# Patient Record
Sex: Female | Born: 1990 | Race: White | Hispanic: No | Marital: Married | Smoking: Current every day smoker
Health system: Southern US, Community
[De-identification: ages and names within clinical notes are randomized; demographics above are authoritative.]

## PROBLEM LIST (undated history)

## (undated) ENCOUNTER — Inpatient Hospital Stay (HOSPITAL_COMMUNITY): Payer: Self-pay

## (undated) DIAGNOSIS — K219 Gastro-esophageal reflux disease without esophagitis: Secondary | ICD-10-CM

## (undated) DIAGNOSIS — F329 Major depressive disorder, single episode, unspecified: Secondary | ICD-10-CM

## (undated) DIAGNOSIS — F32A Depression, unspecified: Secondary | ICD-10-CM

## (undated) DIAGNOSIS — F419 Anxiety disorder, unspecified: Secondary | ICD-10-CM

## (undated) DIAGNOSIS — J45909 Unspecified asthma, uncomplicated: Secondary | ICD-10-CM

## (undated) DIAGNOSIS — O24419 Gestational diabetes mellitus in pregnancy, unspecified control: Secondary | ICD-10-CM

## (undated) HISTORY — PX: TOOTH EXTRACTION: SUR596

---
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
## 2012-04-05 IMAGING — CR DG ELBOW COMPLETE 3+V*L*
3 series · 3 of 3 positions shown · non-contrast
Comparison: None

CLINICAL DATA: Fell.  Left elbow pain.

LEFT ELBOW - COMPLETE 3+ VIEW

[view not recorded (1 of 3)]
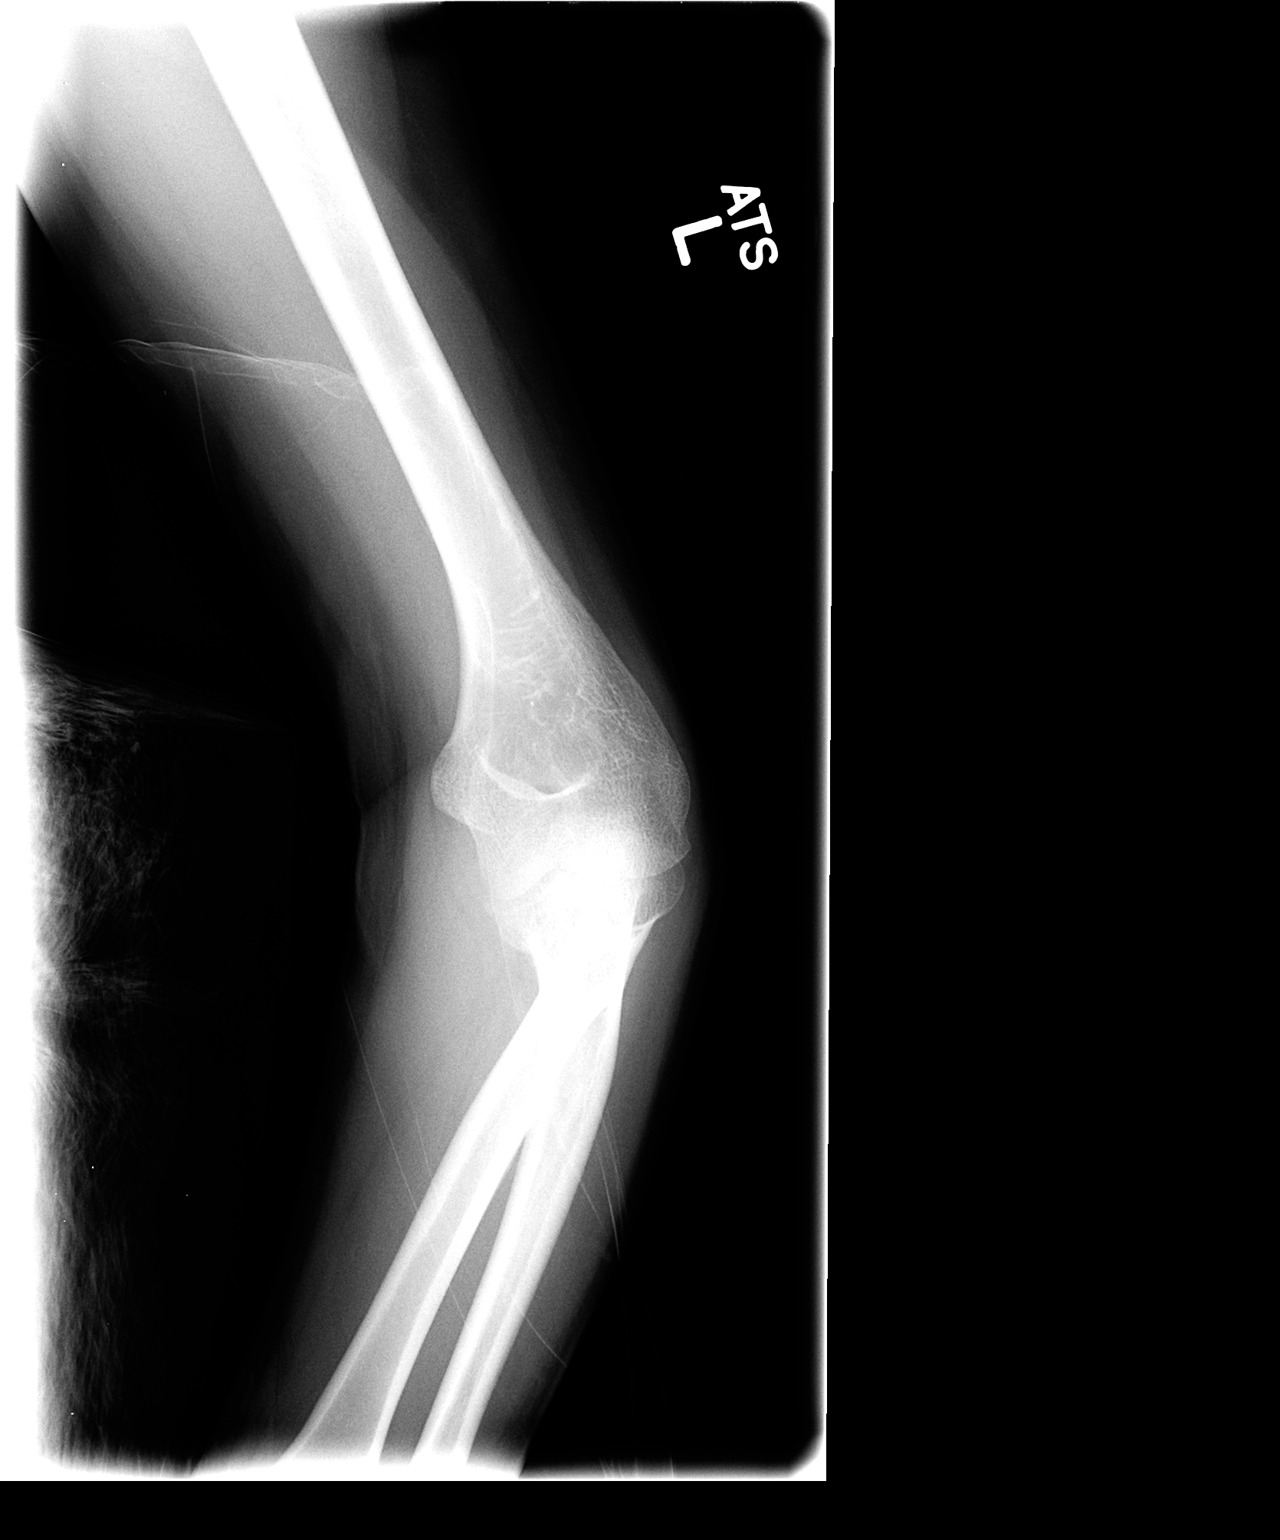

[view not recorded (2 of 3)]
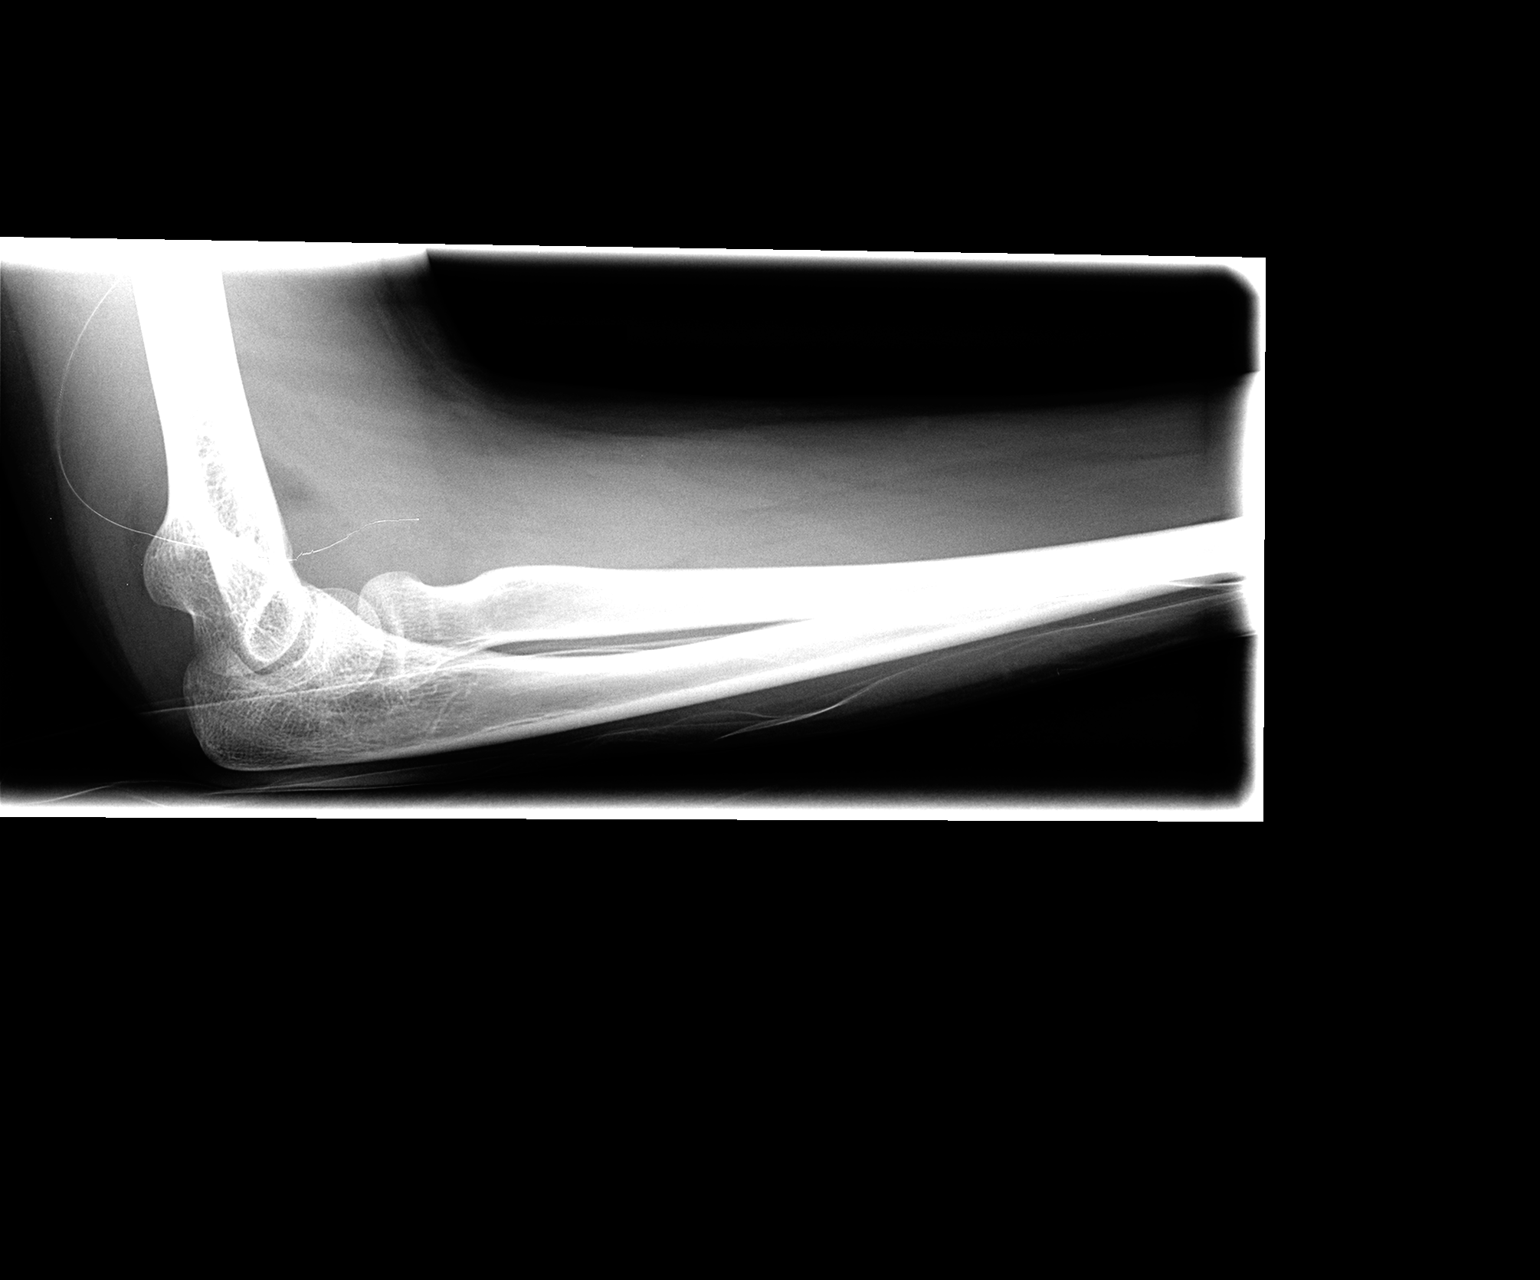

[view not recorded (3 of 3)]
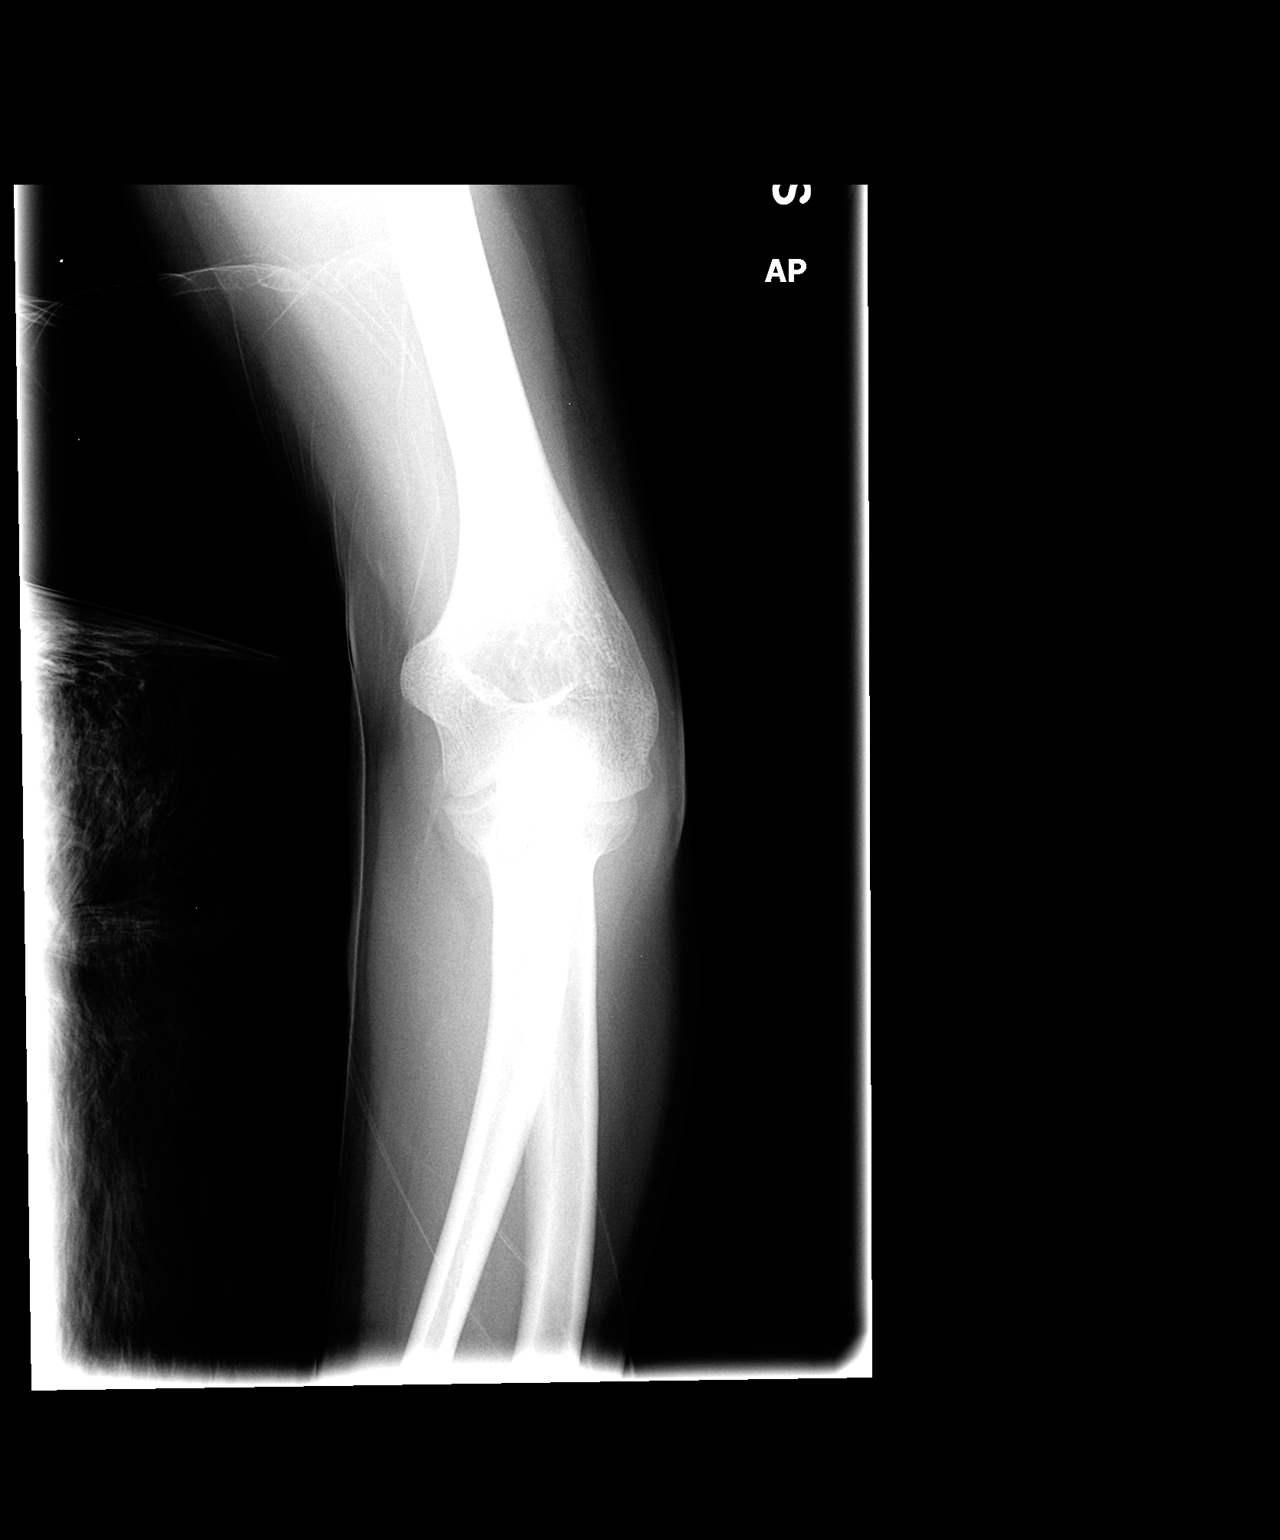

[3 of 3 positions shown; findings below may reference images not displayed]

FINDINGS: The lateral film demonstrates a large elbow joint
effusion.  No obvious fracture.  Limited views due to positioning.
IMPRESSION: 1.  Large elbow joint effusion
2.  No obvious fracture.

## 2012-09-18 LAB — OB RESULTS CONSOLE HEPATITIS B SURFACE ANTIGEN: Hepatitis B Surface Ag: NEGATIVE

## 2012-09-18 LAB — OB RESULTS CONSOLE RPR: RPR: NONREACTIVE

## 2012-09-18 LAB — OB RESULTS CONSOLE GC/CHLAMYDIA: Chlamydia: NEGATIVE

## 2012-09-18 LAB — OB RESULTS CONSOLE HIV ANTIBODY (ROUTINE TESTING): HIV: NONREACTIVE

## 2012-09-18 LAB — OB RESULTS CONSOLE RUBELLA ANTIBODY, IGM: Rubella: NON-IMMUNE/NOT IMMUNE

## 2012-12-30 NOTE — L&D Delivery Note (Signed)
Delivery Note At 2:55 PM a viable and healthy female was delivered via  (Presentation: Right; Occiput Anterior ).  APGAR: , ; weight pending.   Placenta status: spontaneous, intact.  Cord:  3V with a loose nuchal noted x 1  Anesthesia:  Epidural Episiotomy: None Lacerations: None Suture Repair: None Est. Blood Loss (mL): 250cc  Mom to postpartum.  Baby to nursery-stable.  [name] H. 03/23/2013, 3:15 PM

## 2013-02-02 ENCOUNTER — Inpatient Hospital Stay (HOSPITAL_COMMUNITY)
Admission: AD | Admit: 2013-02-02 | Discharge: 2013-02-02 | Disposition: A | Discharge status: Home / Self Care | Payer: Medicaid Other | Source: Ambulatory Visit | Attending: Obstetrics and Gynecology | Admitting: Obstetrics and Gynecology

## 2013-02-02 ENCOUNTER — Encounter (HOSPITAL_COMMUNITY): Payer: Self-pay

## 2013-02-02 DIAGNOSIS — O99891 Other specified diseases and conditions complicating pregnancy: Secondary | ICD-10-CM | POA: Insufficient documentation

## 2013-02-02 DIAGNOSIS — O479 False labor, unspecified: Secondary | ICD-10-CM

## 2013-02-02 DIAGNOSIS — N949 Unspecified condition associated with female genital organs and menstrual cycle: Secondary | ICD-10-CM | POA: Insufficient documentation

## 2013-02-02 DIAGNOSIS — O4700 False labor before 37 completed weeks of gestation, unspecified trimester: Secondary | ICD-10-CM

## 2013-02-02 DIAGNOSIS — N898 Other specified noninflammatory disorders of vagina: Secondary | ICD-10-CM

## 2013-02-02 HISTORY — DX: Gestational diabetes mellitus in pregnancy, unspecified control: O24.419

## 2013-02-02 HISTORY — DX: Major depressive disorder, single episode, unspecified: F32.9

## 2013-02-02 HISTORY — DX: Gastro-esophageal reflux disease without esophagitis: K21.9

## 2013-02-02 HISTORY — DX: Unspecified asthma, uncomplicated: J45.909

## 2013-02-02 HISTORY — DX: Depression, unspecified: F32.A

## 2013-02-02 HISTORY — DX: Anxiety disorder, unspecified: F41.9

## 2013-02-02 LAB — AMNISURE RUPTURE OF MEMBRANE (ROM) NOT AT ARMC: Amnisure ROM: NEGATIVE

## 2013-02-02 LAB — WET PREP, GENITAL

## 2013-02-02 NOTE — MAU Note (Signed)
Pt states had sex last pm, went to restroom, laid back down, got up again and had milky clear fluid running down her legs. Since then has had cramping, and feels like baby will fall out.

## 2013-02-02 NOTE — MAU Provider Note (Signed)
Chief Complaint:  ?ROM  First Provider Initiated Contact with Patient 02/02/13 1305      HPI: Robin Villarreal is a 22 y.o. G2P1001 at [redacted]w[redacted]d who presents to maternity admissions reporting "water broke". The patient states that she got up to go to the bathroom and felt something running down her legs afterwards. She has not felt any LOF since then. She states that she had some contractions earlier today, but has not felt any recently. She has a dull cramping in her lower pelvic area off and on this afternoon. She denies vaginal bleeding. She reports fetal movement, but feels it is slightly decreased this afternoon.    Past Medical History: Past Medical History  Diagnosis Date  . Asthma   . Gestational diabetes   . Anxiety   . Depression   . Acid reflux     Past obstetric history: OB History    Grav Para Term Preterm Abortions TAB SAB Ect Mult Living   2 1 1       1      # Outc Date GA Lbr Len/2nd Wgt Sex Del Anes PTL Lv   1 TRM            2 CUR               Past Surgical History: Past Surgical History  Procedure Date  . Vaginal delivery   . Tooth extraction     Family History: History reviewed. No pertinent family history.  Social History: History  Substance Use Topics  . Smoking status: Current Every Day Smoker -- 0.2 packs/day    Types: Cigarettes  . Smokeless tobacco: Never Used  . Alcohol Use: No    Allergies:  Allergies  Allergen Reactions  . Penicillins Rash    Meds:  No prescriptions prior to admission    ROS: Pertinent findings in history of present illness.  Physical Exam  Blood pressure 114/63, pulse 97, temperature 98.9 F (37.2 C), temperature source Oral, resp. rate 18, height 5' (1.524 m), weight 132 lb (59.875 kg), SpO2 98.00%. GENERAL: Well-developed, well-nourished female in no acute distress.  HEENT: normocephalic HEART: normal rate RESP: normal effort ABDOMEN: Soft, non-tender, gravid appropriate for gestational age EXTREMITIES:  Nontender, no edema NEURO: alert and oriented SPECULUM EXAM: Physiologic discharge, no blood, cervix clean, no pooling noted Dilation: Closed Effacement (%): Thick Cervical Position: Middle Presentation: Undeterminable Exam by:: Nickie Retort, PA/S. Gabriel Earing, RNC  FHT:  Baseline 150 , moderate variability, accelerations present, no decelerations Contractions: few    Labs: Results for orders placed during the hospital encounter of 02/02/13 (from the past 24 hour(s))  WET PREP, GENITAL     Status: Abnormal   Collection Time   02/02/13  1:10 PM      Component Value Range   Yeast Wet Prep HPF POC NONE SEEN  NONE SEEN   Trich, Wet Prep NONE SEEN  NONE SEEN   Clue Cells Wet Prep HPF POC NONE SEEN  NONE SEEN   WBC, Wet Prep HPF POC MANY (*) NONE SEEN  AMNISURE RUPTURE OF MEMBRANE (ROM)     Status: Normal   Collection Time   02/02/13  1:30 PM      Component Value Range   Amnisure ROM NEGATIVE       MAU Course: Pooling - negative Fern - unsure, most likely negative Amnisure - negative  Discussed patient with Dr. Henderson Cloud. She would like to discharge patient today and keep follow-up as scheduled.   Assessment:  1. Vaginal discharge in pregnancy   2. Braxton Hicks contractions     Plan: Discharge home Patient to keep follow-up as scheduled with Dr. Henderson Cloud Reassured that all tests were negative today and she is having Braxton-Hicks contractions that are not causing cervical dilation Patient may return to MAU as needed or if her condition were to change or worsen      Follow-up Information    Follow up with HORVATH,MICHELLE A, MD. (As scheduled)    Contact information:   [address] [place] [address] [phone]           Medication List     As of 02/02/2013  2:40 PM    TAKE these medications         acetaminophen 325 MG tablet   Commonly known as: TYLENOL   Take 650 mg by mouth every 6 (six) hours as needed. pain         Freddi Starr,  PA-C 02/02/2013 2:40 PM

## 2013-03-02 ENCOUNTER — Inpatient Hospital Stay (HOSPITAL_COMMUNITY)
Admission: AD | Admit: 2013-03-02 | Discharge: 2013-03-02 | Disposition: A | Discharge status: Home / Self Care | Payer: Medicaid Other | Source: Ambulatory Visit | Attending: Obstetrics and Gynecology | Admitting: Obstetrics and Gynecology

## 2013-03-02 ENCOUNTER — Encounter (HOSPITAL_COMMUNITY): Payer: Self-pay | Admitting: *Deleted

## 2013-03-02 DIAGNOSIS — A499 Bacterial infection, unspecified: Secondary | ICD-10-CM | POA: Insufficient documentation

## 2013-03-02 DIAGNOSIS — B9689 Other specified bacterial agents as the cause of diseases classified elsewhere: Secondary | ICD-10-CM | POA: Insufficient documentation

## 2013-03-02 DIAGNOSIS — O368190 Decreased fetal movements, unspecified trimester, not applicable or unspecified: Secondary | ICD-10-CM | POA: Insufficient documentation

## 2013-03-02 DIAGNOSIS — O99891 Other specified diseases and conditions complicating pregnancy: Secondary | ICD-10-CM | POA: Insufficient documentation

## 2013-03-02 DIAGNOSIS — N760 Acute vaginitis: Secondary | ICD-10-CM | POA: Insufficient documentation

## 2013-03-02 DIAGNOSIS — O2390 Unspecified genitourinary tract infection in pregnancy, unspecified trimester: Secondary | ICD-10-CM | POA: Insufficient documentation

## 2013-03-02 LAB — WET PREP, GENITAL: Trich, Wet Prep: NONE SEEN

## 2013-03-02 MED ORDER — METRONIDAZOLE 500 MG PO TABS: 500.0000 mg | ORAL_TABLET | Freq: Two times a day (BID) | ORAL | Status: DC

## 2013-03-02 NOTE — MAU Note (Addendum)
Last night had watery d/c( one time thing).  This morning had thick white d/c when got up. Has been having a lot of pelvic pressure, dr told her to come here.

## 2013-03-02 NOTE — MAU Note (Signed)
Pt states she noticed fluid leaking last night, pt states she feels like she has fluid leaking presently. Pt states she had pain before nurse entered the room

## 2013-03-02 NOTE — MAU Provider Note (Signed)
History     CSN: [id number]  Arrival date and time: 03/02/13 1644   First Provider Initiated Contact with Patient 03/02/13 1741      Chief Complaint  Patient presents with  . Labor Eval   HPI Robin Villarreal is 22 y.o. G2P1001 [redacted]w[redacted]d weeks presenting with report that she felt her pelvic bone cracked last night and questioned if baby had dropped.  This am saw a large amount of thick discharge and saw brownish tint when she wiped.   States she has been checked in the office and told she was 3 cms.  Denies vaginal bleeding.   2 contractions today.  Feels wet, like a leak.  Denies gush of fluid.  Decreased fetal movement today.     Past Medical History  Diagnosis Date  . Asthma   . Gestational diabetes   . Anxiety   . Depression   . Acid reflux     Past Surgical History  Procedure Laterality Date  . Vaginal delivery    . Tooth extraction      Family History  Problem Relation Age of Onset  . Diabetes Maternal Grandmother   . Hypertension Maternal Grandmother     History  Substance Use Topics  . Smoking status: Current Every Day Smoker -- 0.25 packs/day    Types: Cigarettes  . Smokeless tobacco: Never Used  . Alcohol Use: No    Allergies:  Allergies  Allergen Reactions  . Penicillins Rash    Prescriptions prior to admission  Medication Sig Dispense Refill  . acetaminophen (TYLENOL) 325 MG tablet Take 650 mg by mouth every 6 (six) hours as needed. pain      . famotidine (PEPCID) 10 MG tablet Take 10 mg by mouth daily.        Review of Systems  Constitutional: Negative.   Respiratory: Negative.   Cardiovascular: Negative.   Gastrointestinal: Negative for abdominal pain.  Genitourinary:       ? Leaking of fluid.  + for thick white brown stained discharge  Neurological: Negative for headaches.   Physical Exam   Blood pressure 130/76, pulse 106, temperature 98.2 F (36.8 C), temperature source Oral, resp. rate 18, height 4' 11.5" (1.511 m), weight 141 lb  (63.957 kg).  Physical Exam  Nursing note and vitals reviewed. Constitutional: She is oriented to person, place, and time. She appears well-developed and well-nourished. No distress.  HENT:  Head: Normocephalic.  Neck: Normal range of motion.  Cardiovascular: Normal rate.   Respiratory: Effort normal.  GI: Soft. She exhibits no distension and no mass. There is no tenderness. There is no rebound and no guarding.  Genitourinary: There is no rash, tenderness or lesion on the right labia. There is no rash, tenderness or lesion on the left labia. Uterus is enlarged. Uterus is not tender. Cervix exhibits no friability. No bleeding around the vagina. Vaginal discharge (yellowish, frothy discharge with odor) found.  Negative for pooling of fluid.  Cervical exam by Massie Bougie, RN as high, 1cm, thick    Neurological: She is alert and oriented to person, place, and time.  Skin: Skin is warm and dry.  Psychiatric: She has a normal mood and affect. Her behavior is normal.   Results for orders placed during the hospital encounter of 03/02/13 (from the past 24 hour(s))  WET PREP, GENITAL     Status: Abnormal   Collection Time    03/02/13  6:50 PM      Result Value Range   Yeast Wet  Prep HPF POC NONE SEEN  NONE SEEN   Trich, Wet Prep NONE SEEN  NONE SEEN   Clue Cells Wet Prep HPF POC FEW (*) NONE SEEN   WBC, Wet Prep HPF POC MANY (*) NONE SEEN  POCT FERN TEST     Status: None   Collection Time    03/02/13  7:03 PM      Result Value Range   POCT Fern Test Negative = intact amniotic membranes      MAU Course  Procedures   FMS reactive.  3 contractions seen with mild irritabilty  MDM Reported MSE to Dr. Henderson Cloud.  Order for fern.  May do AFI if good history of gush of fluid.  She reported leaking only, denies gush.  Fern and wet prep collected 20:00 care turned over to V. Katrinka Blazing, CNM.  No leaking while in MAU. Assessment and Plan  A:  Negative Fern 1. BV (bacterial vaginosis)    P:  D/C  home. Labor precautions, FKCs   Medication List    TAKE these medications       acetaminophen 325 MG tablet  Commonly known as:  TYLENOL  Take 650 mg by mouth every 6 (six) hours as needed. pain     famotidine 10 MG tablet  Commonly known as:  PEPCID  Take 10 mg by mouth daily.     metroNIDAZOLE 500 MG tablet  Commonly known as:  FLAGYL  Take 1 tablet (500 mg total) by mouth 2 (two) times daily.       Follow-up Information   Follow up with HORVATH,MICHELLE A, MD. (as scheduled)    Contact information:   [address] [place] [address] [phone]       Follow up with [facility] [place] [facility]. (As needed if symptoms worsen)    Contact information:   [address] [id number] [place] [address] [phone]        Matt Holmes 03/02/2013, 7:57 PM   Dorathy Kinsman, CNM 03/02/2013 8:54 PM

## 2013-03-23 ENCOUNTER — Encounter (HOSPITAL_COMMUNITY): Payer: Self-pay

## 2013-03-23 ENCOUNTER — Encounter (HOSPITAL_COMMUNITY): Payer: Self-pay | Admitting: Anesthesiology

## 2013-03-23 ENCOUNTER — Inpatient Hospital Stay (HOSPITAL_COMMUNITY)
Admission: AD | Admit: 2013-03-23 | Discharge: 2013-03-24 | DRG: 775 | Disposition: A | Discharge status: Home / Self Care | Payer: Medicaid Other | Source: Ambulatory Visit | Attending: Obstetrics and Gynecology | Admitting: Obstetrics and Gynecology

## 2013-03-23 ENCOUNTER — Inpatient Hospital Stay (HOSPITAL_COMMUNITY): Payer: Medicaid Other | Admitting: Anesthesiology

## 2013-03-23 LAB — CBC
HCT: 27.4 % — ABNORMAL LOW (ref 36.0–46.0)
Hemoglobin: 8.8 g/dL — ABNORMAL LOW (ref 12.0–15.0)
MCV: 87.8 fL (ref 78.0–100.0)
RBC: 3.12 MIL/uL — ABNORMAL LOW (ref 3.87–5.11)
WBC: 14.2 10*3/uL — ABNORMAL HIGH (ref 4.0–10.5)

## 2013-03-23 MED ORDER — METHYLERGONOVINE MALEATE 0.2 MG PO TABS: 0.2000 mg | ORAL_TABLET | ORAL | Status: DC | PRN

## 2013-03-23 MED ORDER — PHENYLEPHRINE 40 MCG/ML (10ML) SYRINGE FOR IV PUSH (FOR BLOOD PRESSURE SUPPORT)
80.0000 ug | PREFILLED_SYRINGE | INTRAVENOUS | Status: DC | PRN
  Filled 2013-03-23: qty 5

## 2013-03-23 MED ORDER — OXYCODONE-ACETAMINOPHEN 5-325 MG PO TABS
1.0000 | ORAL_TABLET | ORAL | Status: DC | PRN
  Administered 2013-03-23 – 2013-03-24 (×5): 1 via ORAL
  Filled 2013-03-23 (×5): qty 1

## 2013-03-23 MED ORDER — IBUPROFEN 600 MG PO TABS
600.0000 mg | ORAL_TABLET | Freq: Four times a day (QID) | ORAL | Status: DC
  Administered 2013-03-23 – 2013-03-24 (×3): 600 mg via ORAL
  Filled 2013-03-23 (×3): qty 1

## 2013-03-23 MED ORDER — LACTATED RINGERS IV SOLN: 500.0000 mL | INTRAVENOUS | Status: DC | PRN

## 2013-03-23 MED ORDER — OXYTOCIN BOLUS FROM INFUSION: 500.0000 mL | INTRAVENOUS | Status: DC

## 2013-03-23 MED ORDER — PHENYLEPHRINE 40 MCG/ML (10ML) SYRINGE FOR IV PUSH (FOR BLOOD PRESSURE SUPPORT): 80.0000 ug | PREFILLED_SYRINGE | INTRAVENOUS | Status: DC | PRN

## 2013-03-23 MED ORDER — BENZOCAINE-MENTHOL 20-0.5 % EX AERO
1.0000 "application " | INHALATION_SPRAY | CUTANEOUS | Status: DC | PRN
  Filled 2013-03-23: qty 56

## 2013-03-23 MED ORDER — LACTATED RINGERS IV SOLN
INTRAVENOUS | Status: DC
  Administered 2013-03-23: 14:00:00 via INTRAVENOUS

## 2013-03-23 MED ORDER — LIDOCAINE HCL (PF) 1 % IJ SOLN
30.0000 mL | INTRAMUSCULAR | Status: DC | PRN
  Filled 2013-03-23: qty 30

## 2013-03-23 MED ORDER — LIDOCAINE HCL (PF) 1 % IJ SOLN
INTRAMUSCULAR | Status: DC | PRN
  Administered 2013-03-23 (×4): 4 mL

## 2013-03-23 MED ORDER — ZOLPIDEM TARTRATE 5 MG PO TABS: 5.0000 mg | ORAL_TABLET | Freq: Every evening | ORAL | Status: DC | PRN

## 2013-03-23 MED ORDER — BUTORPHANOL TARTRATE 1 MG/ML IJ SOLN
1.0000 mg | Freq: Once | INTRAMUSCULAR | Status: AC
  Administered 2013-03-23: 1 mg via INTRAVENOUS
  Filled 2013-03-23: qty 1

## 2013-03-23 MED ORDER — OXYCODONE-ACETAMINOPHEN 5-325 MG PO TABS: 1.0000 | ORAL_TABLET | ORAL | Status: DC | PRN

## 2013-03-23 MED ORDER — LACTATED RINGERS IV SOLN
500.0000 mL | Freq: Once | INTRAVENOUS | Status: AC
  Administered 2013-03-23: 500 mL via INTRAVENOUS

## 2013-03-23 MED ORDER — FLEET ENEMA 7-19 GM/118ML RE ENEM: 1.0000 | ENEMA | RECTAL | Status: DC | PRN

## 2013-03-23 MED ORDER — OXYTOCIN 40 UNITS IN LACTATED RINGERS INFUSION - SIMPLE MED
62.5000 mL/h | INTRAVENOUS | Status: DC
  Administered 2013-03-23: 62.5 mL/h via INTRAVENOUS
  Filled 2013-03-23: qty 1000

## 2013-03-23 MED ORDER — IBUPROFEN 600 MG PO TABS
600.0000 mg | ORAL_TABLET | Freq: Four times a day (QID) | ORAL | Status: DC | PRN
  Administered 2013-03-23: 600 mg via ORAL
  Filled 2013-03-23: qty 1

## 2013-03-23 MED ORDER — DIPHENHYDRAMINE HCL 50 MG/ML IJ SOLN: 12.5000 mg | INTRAMUSCULAR | Status: DC | PRN

## 2013-03-23 MED ORDER — LANOLIN HYDROUS EX OINT: TOPICAL_OINTMENT | CUTANEOUS | Status: DC | PRN

## 2013-03-23 MED ORDER — OXYTOCIN 40 UNITS IN LACTATED RINGERS INFUSION - SIMPLE MED
1.0000 m[IU]/min | INTRAVENOUS | Status: DC
  Administered 2013-03-23: 2 m[IU]/min via INTRAVENOUS

## 2013-03-23 MED ORDER — DIPHENHYDRAMINE HCL 25 MG PO CAPS: 25.0000 mg | ORAL_CAPSULE | Freq: Four times a day (QID) | ORAL | Status: DC | PRN

## 2013-03-23 MED ORDER — SENNOSIDES-DOCUSATE SODIUM 8.6-50 MG PO TABS
2.0000 | ORAL_TABLET | Freq: Every day | ORAL | Status: DC
  Administered 2013-03-23: 2 via ORAL

## 2013-03-23 MED ORDER — DIBUCAINE 1 % RE OINT
1.0000 "application " | TOPICAL_OINTMENT | RECTAL | Status: DC | PRN
  Filled 2013-03-23: qty 28

## 2013-03-23 MED ORDER — TERBUTALINE SULFATE 1 MG/ML IJ SOLN: 0.2500 mg | Freq: Once | INTRAMUSCULAR | Status: DC | PRN

## 2013-03-23 MED ORDER — ONDANSETRON HCL 4 MG PO TABS: 4.0000 mg | ORAL_TABLET | ORAL | Status: DC | PRN

## 2013-03-23 MED ORDER — ACETAMINOPHEN 325 MG PO TABS: 650.0000 mg | ORAL_TABLET | ORAL | Status: DC | PRN

## 2013-03-23 MED ORDER — ONDANSETRON HCL 4 MG/2ML IJ SOLN
4.0000 mg | Freq: Four times a day (QID) | INTRAMUSCULAR | Status: DC | PRN
  Administered 2013-03-23: 4 mg via INTRAVENOUS
  Filled 2013-03-23: qty 2

## 2013-03-23 MED ORDER — FENTANYL 2.5 MCG/ML BUPIVACAINE 1/10 % EPIDURAL INFUSION (WH - ANES)
14.0000 mL/h | INTRAMUSCULAR | Status: DC | PRN
  Administered 2013-03-23: 14 mL/h via EPIDURAL
  Filled 2013-03-23: qty 125

## 2013-03-23 MED ORDER — EPHEDRINE 5 MG/ML INJ
10.0000 mg | INTRAVENOUS | Status: DC | PRN
  Filled 2013-03-23: qty 4

## 2013-03-23 MED ORDER — TETANUS-DIPHTH-ACELL PERTUSSIS 5-2.5-18.5 LF-MCG/0.5 IM SUSP
0.5000 mL | Freq: Once | INTRAMUSCULAR | Status: DC
  Filled 2013-03-23: qty 0.5

## 2013-03-23 MED ORDER — EPHEDRINE 5 MG/ML INJ: 10.0000 mg | INTRAVENOUS | Status: DC | PRN

## 2013-03-23 MED ORDER — SIMETHICONE 80 MG PO CHEW: 80.0000 mg | CHEWABLE_TABLET | ORAL | Status: DC | PRN

## 2013-03-23 MED ORDER — WITCH HAZEL-GLYCERIN EX PADS: 1.0000 "application " | MEDICATED_PAD | CUTANEOUS | Status: DC | PRN

## 2013-03-23 MED ORDER — CITRIC ACID-SODIUM CITRATE 334-500 MG/5ML PO SOLN: 30.0000 mL | ORAL | Status: DC | PRN

## 2013-03-23 MED ORDER — INFLUENZA VIRUS VACC SPLIT PF IM SUSP
0.5000 mL | INTRAMUSCULAR | Status: AC
  Administered 2013-03-24: 0.5 mL via INTRAMUSCULAR

## 2013-03-23 MED ORDER — METHYLERGONOVINE MALEATE 0.2 MG/ML IJ SOLN: 0.2000 mg | INTRAMUSCULAR | Status: DC | PRN

## 2013-03-23 MED ORDER — ONDANSETRON HCL 4 MG/2ML IJ SOLN: 4.0000 mg | INTRAMUSCULAR | Status: DC | PRN

## 2013-03-23 MED ORDER — PRENATAL MULTIVITAMIN CH
1.0000 | ORAL_TABLET | Freq: Every day | ORAL | Status: DC
  Filled 2013-03-23: qty 1

## 2013-03-23 NOTE — H&P (Signed)
RONELLE MICHIE is a 22 y.o. female presenting for Labor  22 yo G2P1001 @ 39+5 presents for labor. Her pregnancy has been uncomplicated History OB History   Grav Para Term Preterm Abortions TAB SAB Ect Mult Living   2 1 1       1      Past Medical History  Diagnosis Date  . Asthma   . Gestational diabetes   . Anxiety   . Depression   . Acid reflux    Past Surgical History  Procedure Laterality Date  . Vaginal delivery    . Tooth extraction     Family History: family history includes Diabetes in her maternal grandmother and Hypertension in her maternal grandmother. Social History:  reports that she has been smoking Cigarettes.  She has been smoking about 0.25 packs per day. She has never used smokeless tobacco. She reports that she does not drink alcohol or use illicit drugs.   Prenatal Transfer Tool  Maternal Diabetes: No Genetic Screening: declined Maternal Ultrasounds/Referrals: Normal Fetal Ultrasounds or other Referrals:  None Maternal Substance Abuse:  No Significant Maternal Medications:  None Significant Maternal Lab Results:  None Other Comments:  None  ROS  Dilation: 5 Effacement (%): 80 Station: -2 Exam by:: Dr Tenny Craw Blood pressure 112/70, pulse 107, temperature 97.9 F (36.6 C), temperature source Oral, resp. rate 16, height 5\' 3"  (1.6 m), weight 65.772 kg (145 lb), SpO2 100.00%. Exam Physical Exam  Prenatal labs: ABO, Rh: B/Negative/-- (09/20 0000) Antibody: Negative (09/20 0000) Rubella: Nonimmune (09/20 0000) RPR: Nonreactive (09/20 0000)  HBsAg: Negative (09/20 0000)  HIV: Non-reactive (09/20 0000)  GBS: Negative (02/27 0000)   Assessment/Plan: 1) Admit 2) Epidural on request   Azharia Surratt H. 03/23/2013, 9:58 AM

## 2013-03-23 NOTE — Anesthesia Procedure Notes (Signed)
Epidural Patient location during procedure: OB Start time: 03/23/2013 8:27 AM  Staffing Performed by: anesthesiologist   Preanesthetic Checklist Completed: patient identified, site marked, surgical consent, pre-op evaluation, timeout performed, IV checked, risks and benefits discussed and monitors and equipment checked  Epidural Patient position: sitting Prep: site prepped and draped and DuraPrep Patient monitoring: continuous pulse ox and blood pressure Approach: midline Injection technique: LOR air  Needle:  Needle type: Tuohy  Needle gauge: 17 G Needle length: 9 cm and 9 Needle insertion depth: 6 cm Catheter type: closed end flexible Catheter size: 19 Gauge Catheter at skin depth: 11 cm Test dose: negative  Assessment Events: blood not aspirated, injection not painful, no injection resistance, negative IV test and no paresthesia  Additional Notes Discussed risk of headache, infection, bleeding, nerve injury and failed or incomplete block.  Patient voices understanding and wishes to proceed.  Epidural placed easily on first attempt.  No paresthesia.  Patient tolerated procedure well with no apparent complications.  Jasmine December, MD Reason for block:procedure for pain

## 2013-03-23 NOTE — Anesthesia Preprocedure Evaluation (Signed)
Anesthesia Evaluation  Patient identified by MRN, date of birth, ID band Patient awake    Reviewed: Allergy & Precautions, H&P , NPO status , Patient's Chart, lab work & pertinent test results, reviewed documented beta blocker date and time   History of Anesthesia Complications Negative for: history of anesthetic complications  Airway Mallampati: II TM Distance: >3 FB Neck ROM: full    Dental  (+) Teeth Intact   Pulmonary asthma (no inhaler use in years) , Current Smoker,  breath sounds clear to auscultation        Cardiovascular negative cardio ROS  Rhythm:regular Rate:Normal     Neuro/Psych PSYCHIATRIC DISORDERS (anxiety/depression) negative neurological ROS     GI/Hepatic Neg liver ROS, GERD-  Medicated,  Endo/Other  negative endocrine ROSneg diabetes  Renal/GU negative Renal ROS     Musculoskeletal   Abdominal   Peds  Hematology  (+) Blood dyscrasia (starting hgb 8.8), anemia ,   Anesthesia Other Findings   Reproductive/Obstetrics (+) Pregnancy                           Anesthesia Physical Anesthesia Plan  ASA: II  Anesthesia Plan: Epidural   Post-op Pain Management:    Induction:   Airway Management Planned:   Additional Equipment:   Intra-op Plan:   Post-operative Plan:   Informed Consent: I have reviewed the patients History and Physical, chart, labs and discussed the procedure including the risks, benefits and alternatives for the proposed anesthesia with the patient or authorized representative who has indicated his/her understanding and acceptance.     Plan Discussed with:   Anesthesia Plan Comments:         Anesthesia Quick Evaluation

## 2013-03-23 NOTE — MAU Note (Signed)
Pt states contractions started yesterday afternoon and have progressively gotten stronger and closer together

## 2013-03-24 LAB — CBC
MCH: 28.3 pg (ref 26.0–34.0)
Platelets: 128 10*3/uL — ABNORMAL LOW (ref 150–400)
RBC: 2.54 MIL/uL — ABNORMAL LOW (ref 3.87–5.11)
WBC: 13.8 10*3/uL — ABNORMAL HIGH (ref 4.0–10.5)

## 2013-03-24 NOTE — Progress Notes (Signed)
Post Partum Day 1 Subjective: no complaints  Objective: Blood pressure 113/68, pulse 78, temperature 97.7 F (36.5 C), temperature source Oral, resp. rate 20, height 5\' 3"  (1.6 m), weight 65.772 kg (145 lb), SpO2 98.00%, not breastfeeding.  Physical Exam:  General: alert Lochia: appropriate Uterine Fundus: firm   Recent Labs  03/23/13 0720 03/24/13 0610  HGB 8.8* 7.2*  HCT 27.4* 22.6*    Assessment/Plan: Plan for discharge tomorrow.  Patient may request early discharge this afternoon.  Verbal instructions given.  Patient will alert her nurse if she can and wants to leave later today.  Patient has not taken any iron during the pregnancy.  I encouraged her to take iron post partum.  Her hemoglobin today is 7.2 gms.   LOS: 1 day   Madalynne Gutmann D 03/24/2013, 10:17 AM

## 2013-03-24 NOTE — Anesthesia Postprocedure Evaluation (Signed)
  Anesthesia Post-op Note  Patient: Robin Villarreal  Procedure(s) Performed: * No procedures listed *  Patient Location: Mother/Baby  Anesthesia Type:Epidural  Level of Consciousness: awake, alert , oriented and patient cooperative  Airway and Oxygen Therapy: Patient Spontanous Breathing  Post-op Pain: none  Post-op Assessment: Post-op Vital signs reviewed and Patient's Cardiovascular Status Stable  Post-op Vital Signs: Reviewed and stable  Complications: No apparent anesthesia complications

## 2013-03-24 NOTE — Progress Notes (Signed)
Ur chart review completed.  

## 2013-03-24 NOTE — Discharge Summary (Signed)
Obstetric Discharge Summary Reason for Admission: onset of labor Prenatal Procedures: ultrasound Intrapartum Procedures: spontaneous vaginal delivery Postpartum Procedures: none Complications-Operative and Postpartum: None Hemoglobin  Date Value Range Status  03/24/2013 7.2* 12.0 - 15.0 g/dL Final     DELTA CHECK NOTED     REPEATED TO VERIFY     HCT  Date Value Range Status  03/24/2013 22.6* 36.0 - 46.0 % Final    Physical Exam:  General: alert Lochia: appropriate Uterine Fundus: firm  Discharge Diagnoses: Term Pregnancy-delivered  Discharge Information: Date: 03/24/2013 Activity: pelvic rest Diet: routine Medications: PNV, Ibuprofen and Iron Condition: stable Instructions: refer to practice specific booklet Discharge to: home Follow-up Information   Schedule an appointment as soon as possible for a visit with Almon Hercules., MD.   Contact information:   [address] [place] [address] [phone]       Newborn Data: Live born female  Birth Weight: 6 lb 11 oz (3033 g) APGAR: 6, 9  Home with mother.  Mickel Baas 03/24/2013, 4:43 PM

## 2013-03-24 NOTE — Clinical Social Work Psychosocial (Signed)
     Clinical Social Work Department BRIEF PSYCHOSOCIAL ASSESSMENT 03/24/2013  Patient:  Robin Villarreal, Robin Villarreal     Account Number:  [id number]     Admit date:  03/23/2013  Clinical Social Worker:  Andy Gauss  Date/Time:  03/24/2013 11:31 AM  Referred by:  Physician  Date Referred:  03/24/2013 Referred for  Behavioral Health Issues   Other Referral:   Hx depression/anxiety   Interview type:  Patient Other interview type:    PSYCHOSOCIAL DATA Living Status:  HUSBAND Admitted from facility:   Level of care:   Primary support name:  Josslin Sanjuan Primary support relationship to patient:  SPOUSE Degree of support available:   Involved    CURRENT CONCERNS Current Concerns  Behavioral Health Issues   Other Concerns:    SOCIAL WORK ASSESSMENT / PLAN CSW referral received to assess pt's history of depression/anxiety.  Pt experienced depression symptoms at age 22, which resulted in a suicide attempt.  Pt states she was never hospitalized but followed closely by staff at [facility].  Her symptoms were treated with medication at that time.  Pt told CSW that she experienced PP depression after the birth of her daughter in 2011 but never discussed symptoms with anyone.  She remembers crying all the time & feeling like her "motherly instincts never kicked in."  She denies any thoughts of wanting to harm the infant & denies SI in 5 years.  During the beginning of this pregnancy, pt was prescribed Prozac, of which she took for 1 month after describing symptoms to her OB.  Pt told CSW that she stopped taking them voluntarily & was able to cope well without the medication.  She reports feeling fine now. Pt's spouse is at the bedside, aware of her history & supportive.  Pt agrees to seek medical attention if PP depression symptoms arise upon discharge.  Pt is not interested in restarting an antidepressant now.  She has all the necessary supplies for the infant & appears to be appropriate at  this time.  CSW provided pt with PP depression literature & encouraged her to seek help if needed.  Pt agrees.   Assessment/plan status:  No Further Intervention Required Other assessment/ plan:   Information/referral to community resources:   PP depression literature given.    PATIENTS/FAMILYS RESPONSE TO PLAN OF CARE: Pt & spouse were receptive to information discussed.

## 2014-10-31 ENCOUNTER — Encounter (HOSPITAL_COMMUNITY): Payer: Self-pay

## 2019-09-11 ENCOUNTER — Emergency Department (HOSPITAL_COMMUNITY)
Admission: EM | Admit: 2019-09-11 | Discharge: 2019-09-12 | Disposition: A | Discharge status: Home / Self Care | Payer: Self-pay | Attending: Emergency Medicine | Admitting: Emergency Medicine

## 2019-09-11 ENCOUNTER — Other Ambulatory Visit: Payer: Self-pay

## 2019-09-11 ENCOUNTER — Encounter (HOSPITAL_COMMUNITY): Payer: Self-pay

## 2019-09-11 DIAGNOSIS — J45909 Unspecified asthma, uncomplicated: Secondary | ICD-10-CM | POA: Insufficient documentation

## 2019-09-11 DIAGNOSIS — F17210 Nicotine dependence, cigarettes, uncomplicated: Secondary | ICD-10-CM | POA: Insufficient documentation

## 2019-09-11 DIAGNOSIS — K2900 Acute gastritis without bleeding: Secondary | ICD-10-CM | POA: Insufficient documentation

## 2019-09-11 MED ORDER — SODIUM CHLORIDE 0.9% FLUSH: 3.0000 mL | Freq: Once | INTRAVENOUS | Status: DC

## 2019-09-11 NOTE — ED Triage Notes (Signed)
Pt reports epigastric and RUQ pain. Pt has had a gallbladder US, CT scans, and blood work over the last 2 weeks. She states that she is scheduled for a HIDA scan next week, but states that tonight, her pain and vomiting worsened. States that she has been following recommended dietary restrictions. Pt also reports lightheadedness and increased pain with moving.

## 2019-09-11 NOTE — ED Notes (Signed)
Patient aware that we need urine sample for testing, unable at this time. Pt given instruction on providing urine sample when able to do so.   

## 2019-09-12 LAB — URINALYSIS, ROUTINE W REFLEX MICROSCOPIC
Bilirubin Urine: NEGATIVE
Glucose, UA: NEGATIVE mg/dL
Ketones, ur: 80 mg/dL — AB
Nitrite: NEGATIVE
Protein, ur: 30 mg/dL — AB
Specific Gravity, Urine: 1.029 (ref 1.005–1.030)
pH: 5 (ref 5.0–8.0)

## 2019-09-12 LAB — COMPREHENSIVE METABOLIC PANEL
ALT: 9 U/L (ref 0–44)
AST: 19 U/L (ref 15–41)
Albumin: 5 g/dL (ref 3.5–5.0)
Alkaline Phosphatase: 57 U/L (ref 38–126)
Anion gap: 14 (ref 5–15)
BUN: 14 mg/dL (ref 6–20)
CO2: 22 mmol/L (ref 22–32)
Calcium: 9.8 mg/dL (ref 8.9–10.3)
Chloride: 101 mmol/L (ref 98–111)
Creatinine, Ser: 0.69 mg/dL (ref 0.44–1.00)
GFR calc Af Amer: >60 mL/min (ref >60)
GFR calc non Af Amer: >60 mL/min (ref >60)
Glucose, Bld: 77 mg/dL (ref 70–99)
Potassium: 3.5 mmol/L (ref 3.5–5.1)
Sodium: 137 mmol/L (ref 135–145)
Total Bilirubin: 1.6 mg/dL — ABNORMAL HIGH (ref 0.3–1.2)
Total Protein: 8.9 g/dL — ABNORMAL HIGH (ref 6.5–8.1)

## 2019-09-12 LAB — CBC
HCT: 44.2 % (ref 36.0–46.0)
Hemoglobin: 14.9 g/dL (ref 12.0–15.0)
MCH: 31.6 pg (ref 26.0–34.0)
MCHC: 33.7 g/dL (ref 30.0–36.0)
MCV: 93.6 fL (ref 80.0–100.0)
Platelets: 249 10*3/uL (ref 150–400)
RBC: 4.72 MIL/uL (ref 3.87–5.11)
RDW: 12.1 % (ref 11.5–15.5)
WBC: 9.2 10*3/uL (ref 4.0–10.5)
nRBC: 0 % (ref 0.0–0.2)

## 2019-09-12 LAB — LIPASE, BLOOD: Lipase: 26 U/L (ref 11–51)

## 2019-09-12 LAB — I-STAT BETA HCG BLOOD, ED (MC, WL, AP ONLY): I-stat hCG, quantitative: <5 m[IU]/mL (ref <5)

## 2019-09-12 MED ORDER — ONDANSETRON HCL 4 MG/2ML IJ SOLN
4.0000 mg | Freq: Once | INTRAMUSCULAR | Status: AC
  Administered 2019-09-12: 4 mg via INTRAVENOUS
  Filled 2019-09-12: qty 2

## 2019-09-12 MED ORDER — METOCLOPRAMIDE HCL 5 MG/ML IJ SOLN
10.0000 mg | Freq: Once | INTRAMUSCULAR | Status: AC
  Administered 2019-09-12: 10 mg via INTRAVENOUS
  Filled 2019-09-12: qty 2

## 2019-09-12 MED ORDER — SUCRALFATE 1 G PO TABS: 1.0000 g | ORAL_TABLET | Freq: Three times a day (TID) | ORAL | 0 refills | Status: AC

## 2019-09-12 MED ORDER — ONDANSETRON HCL 4 MG PO TABS: 4.0000 mg | ORAL_TABLET | Freq: Four times a day (QID) | ORAL | 2 refills | Status: AC

## 2019-09-12 MED ORDER — SODIUM CHLORIDE 0.9 % IV BOLUS
1000.0000 mL | Freq: Once | INTRAVENOUS | Status: AC
  Administered 2019-09-12: 1000 mL via INTRAVENOUS

## 2019-09-12 MED ORDER — PROMETHAZINE HCL 25 MG RE SUPP: 25.0000 mg | Freq: Four times a day (QID) | RECTAL | 0 refills | Status: AC | PRN

## 2019-09-12 MED ORDER — HYDROMORPHONE HCL 1 MG/ML IJ SOLN
1.0000 mg | Freq: Once | INTRAMUSCULAR | Status: AC
  Administered 2019-09-12: 1 mg via INTRAVENOUS
  Filled 2019-09-12: qty 1

## 2019-09-12 NOTE — ED Provider Notes (Signed)
[facility]-EMERGENCY DEPT Provider Note   CSN: [id number] Arrival date & time: 09/11/19  2151     History   Chief Complaint Chief Complaint  Patient presents with  . Abdominal Pain    HPI Robin Villarreal is a 28 y.o. female.     Patient presents to the emergency department for evaluation of abdominal pain with nausea and vomiting.  Patient reports that she does have a history of reflux, currently is not taking any medication for her reflux at this time.  Over the last week or so she has been experiencing severe upper abdominal and right sided abdominal pain with nausea and vomiting.  She has been seen in the ER at [facility] several times.  She has had a CAT scan and an ultrasound that were reportedly normal.  She has contacted her GI doctor who has ordered a HIDA scan for later this week.  She also has had a recent endoscopy prior to the symptoms.  Patient has not had any vomiting of blood or melena.  No fever.     Past Medical History:  Diagnosis Date  . Acid reflux   . Anxiety   . Asthma   . Depression   . Gestational diabetes     There are no active problems to display for this patient.   Past Surgical History:  Procedure Laterality Date  . TOOTH EXTRACTION    . VAGINAL DELIVERY       OB History    Gravida  2   Para  2   Term  2   Preterm      AB      Living  2     SAB      TAB      Ectopic      Multiple      Live Births  2            Home Medications    Prior to Admission medications   Medication Sig Start Date End Date Taking? Authorizing Provider  acetaminophen (TYLENOL) 500 MG tablet Take 500 mg by mouth every 6 (six) hours as needed for mild pain or headache.   Yes [provider]  Acetaminophen-Caffeine (TENSION HEADACHE RELIEF PO) Take 2 tablets by mouth every 6 (six) hours as needed (headache). Drug bottle says acetaminophen and caffeine are the main ingredients in the tablet, but does not  mention dosage of drugs contained in product.   Yes [provider]  alum & mag hydroxide-simeth (MAALOX/MYLANTA) 200-200-20 MG/5ML suspension Take 15 mLs by mouth every 6 (six) hours as needed for indigestion or heartburn.   Yes [provider]  dicyclomine (BENTYL) 20 MG tablet Take 20 mg by mouth every 6 (six) hours as needed for spasms.   Yes [provider]  HYDROcodone-acetaminophen (NORCO/VICODIN) 5-325 MG tablet Take 1 tablet by mouth every 6 (six) hours as needed for moderate pain.   Yes [provider]  simethicone (MYLICON) 125 MG chewable tablet Chew 125 mg by mouth every 6 (six) hours as needed for flatulence.   Yes [provider]  ondansetron (ZOFRAN) 4 MG tablet Take 1 tablet (4 mg total) by mouth every 6 (six) hours. 09/12/19   Gilda CreasePollina, Christopher J, MD  promethazine (PHENERGAN) 25 MG suppository Place 1 suppository (25 mg total) rectally every 6 (six) hours as needed for nausea or vomiting. 09/12/19   Pollina, Canary Brimhristopher J, MD  sucralfate (CARAFATE) 1 g tablet Take 1 tablet (1 g  total) by mouth 4 (four) times daily -  with meals and at bedtime. 09/12/19   Orpah Greek, MD    Family History Family History  Problem Relation Age of Onset  . Diabetes Maternal Grandmother   . Hypertension Maternal Grandmother     Social History Social History   Tobacco Use  . Smoking status: Current Every Day Smoker    Packs/day: 0.25    Types: Cigarettes  . Smokeless tobacco: Never Used  Substance Use Topics  . Alcohol use: No  . Drug use: No     Allergies   Penicillins   Review of Systems Review of Systems  Gastrointestinal: Positive for abdominal pain, nausea and vomiting.  All other systems reviewed and are negative.    Physical Exam Updated Vital Signs BP 107/68 (BP Location: Right Arm)   Pulse (!) 59   Temp 98.7 F (37.1 C) (Oral) Comment: Pt has been taking tylenol throughout the day  Resp 18   Ht 5\' 1"  (1.549  m)   Wt 54 kg   SpO2 99%   BMI 22.48 kg/m   Physical Exam Vitals signs and nursing note reviewed.  Constitutional:      General: She is not in acute distress.    Appearance: Normal appearance. She is well-developed.  HENT:     Head: Normocephalic and atraumatic.     Right Ear: Hearing normal.     Left Ear: Hearing normal.     Nose: Nose normal.  Eyes:     Conjunctiva/sclera: Conjunctivae normal.     Pupils: Pupils are equal, round, and reactive to light.  Neck:     Musculoskeletal: Normal range of motion and neck supple.  Cardiovascular:     Rate and Rhythm: Regular rhythm.     Heart sounds: S1 normal and S2 normal. No murmur. No friction rub. No gallop.   Pulmonary:     Effort: Pulmonary effort is normal. No respiratory distress.     Breath sounds: Normal breath sounds.  Chest:     Chest wall: No tenderness.  Abdominal:     General: Bowel sounds are normal.     Palpations: Abdomen is soft.     Tenderness: There is abdominal tenderness in the right upper quadrant and epigastric area. There is no guarding or rebound. Negative signs include Murphy's sign and McBurney's sign.     Hernia: No hernia is present.  Musculoskeletal: Normal range of motion.  Skin:    General: Skin is warm and dry.     Findings: No rash.  Neurological:     Mental Status: She is alert and oriented to person, place, and time.     GCS: GCS eye subscore is 4. GCS verbal subscore is 5. GCS motor subscore is 6.     Cranial Nerves: No cranial nerve deficit.     Sensory: No sensory deficit.     Coordination: Coordination normal.  Psychiatric:        Speech: Speech normal.        Behavior: Behavior normal.        Thought Content: Thought content normal.      ED Treatments / Results  Labs (all labs ordered are listed, but only abnormal results are displayed) Labs Reviewed  COMPREHENSIVE METABOLIC PANEL - Abnormal; Notable for the following components:      Result Value   Total Protein 8.9 (*)     Total Bilirubin 1.6 (*)    All other components within normal limits  URINALYSIS, ROUTINE W REFLEX MICROSCOPIC - Abnormal; Notable for the following components:   APPearance HAZY (*)    Hgb urine dipstick SMALL (*)    Ketones, ur 80 (*)    Protein, ur 30 (*)    Leukocytes,Ua TRACE (*)    Bacteria, UA RARE (*)    All other components within normal limits  LIPASE, BLOOD  CBC  I-STAT BETA HCG BLOOD, ED ([facility], [facility], [facility] ONLY)    EKG None  Radiology No results found.  Procedures Procedures (including critical care time)  Medications Ordered in ED Medications  sodium chloride flush (NS) 0.9 % injection 3 mL (has no administration in time range)  sodium chloride 0.9 % bolus 1,000 mL (0 mLs Intravenous Stopped 09/12/19 0342)  HYDROmorphone (DILAUDID) injection 1 mg (1 mg Intravenous Given 09/12/19 0016)  ondansetron (ZOFRAN) injection 4 mg (4 mg Intravenous Given 09/12/19 0016)  metoCLOPramide (REGLAN) injection 10 mg (10 mg Intravenous Given 09/12/19 0016)     Initial Impression / Assessment and Plan / ED Course  I have reviewed the triage vital signs and the nursing notes.  Pertinent labs & imaging results that were available during my care of the patient were reviewed by me and considered in my medical decision making (see chart for details).        Patient presents to the emergency department for evaluation of abdominal pain.  She has had associated nausea and vomiting.  Patient is currently being worked up for possible gallbladder disease, however, she has had a CAT scan and an ultrasound that were negative.  Patient reports acute nausea and vomiting with increased pain and burning sensation up into her throat every time she eats.  I doubt this is caused by gallbladder dysfunction alone.  She likely is having exacerbation of her reflux causing the symptoms.  She is not currently taking any acid blockers.  She reports that she has a prescription at the pharmacy that she has not picked  up.  I have encouraged her to initiate treatment for her GERD and will provide her with Carafate and nausea medication.  She is to follow-up with her GI doctor.  Final Clinical Impressions(s) / ED Diagnoses   Final diagnoses:  Acute gastritis without hemorrhage, unspecified gastritis type    ED Discharge Orders         Ordered    sucralfate (CARAFATE) 1 g tablet  3 times daily with meals & bedtime     09/12/19 0354    ondansetron (ZOFRAN) 4 MG tablet  Every 6 hours     09/12/19 0354    promethazine (PHENERGAN) 25 MG suppository  Every 6 hours PRN     09/12/19 0354           Gilda Crease, MD 09/12/19 [phone]

## 2019-09-12 NOTE — Discharge Instructions (Addendum)
Follow-up for the HIDA scan as scheduled and follow-up with your GI doctor in the office this week for further instructions.
